# Patient Record
Sex: Male | Born: 1935 | Race: Black or African American | Hispanic: No | Smoking: Current every day smoker
Health system: Southern US, Community
[De-identification: ages and names within clinical notes are randomized; demographics above are authoritative.]

## PROBLEM LIST (undated history)

## (undated) DIAGNOSIS — D649 Anemia, unspecified: Secondary | ICD-10-CM

## (undated) DIAGNOSIS — C801 Malignant (primary) neoplasm, unspecified: Secondary | ICD-10-CM

## (undated) DIAGNOSIS — R319 Hematuria, unspecified: Secondary | ICD-10-CM

## (undated) DIAGNOSIS — F0390 Unspecified dementia without behavioral disturbance: Secondary | ICD-10-CM

## (undated) DIAGNOSIS — R6251 Failure to thrive (child): Secondary | ICD-10-CM

## (undated) DIAGNOSIS — M810 Age-related osteoporosis without current pathological fracture: Secondary | ICD-10-CM

---

## 2006-09-12 ENCOUNTER — Other Ambulatory Visit: Payer: Self-pay

## 2006-09-13 ENCOUNTER — Inpatient Hospital Stay: Payer: Self-pay | Admitting: Internal Medicine

## 2007-04-11 ENCOUNTER — Emergency Department: Payer: Self-pay | Admitting: Emergency Medicine

## 2010-04-03 ENCOUNTER — Ambulatory Visit: Payer: Self-pay | Admitting: Urology

## 2015-04-21 ENCOUNTER — Emergency Department: Payer: Medicare Other

## 2015-04-21 ENCOUNTER — Encounter: Payer: Self-pay | Admitting: Emergency Medicine

## 2015-04-21 ENCOUNTER — Emergency Department
Admission: EM | Admit: 2015-04-21 | Discharge: 2015-04-21 | Disposition: A | Discharge status: Home / Self Care | Payer: Medicare Other | Attending: Emergency Medicine | Admitting: Emergency Medicine

## 2015-04-21 DIAGNOSIS — R1031 Right lower quadrant pain: Secondary | ICD-10-CM | POA: Diagnosis present

## 2015-04-21 DIAGNOSIS — F17200 Nicotine dependence, unspecified, uncomplicated: Secondary | ICD-10-CM | POA: Insufficient documentation

## 2015-04-21 DIAGNOSIS — F0390 Unspecified dementia without behavioral disturbance: Secondary | ICD-10-CM | POA: Diagnosis not present

## 2015-04-21 DIAGNOSIS — I499 Cardiac arrhythmia, unspecified: Secondary | ICD-10-CM | POA: Diagnosis not present

## 2015-04-21 DIAGNOSIS — R109 Unspecified abdominal pain: Secondary | ICD-10-CM | POA: Diagnosis not present

## 2015-04-21 HISTORY — DX: Malignant (primary) neoplasm, unspecified: C80.1

## 2015-04-21 HISTORY — DX: Unspecified dementia, unspecified severity, without behavioral disturbance, psychotic disturbance, mood disturbance, and anxiety: F03.90

## 2015-04-21 LAB — CBC WITH DIFFERENTIAL/PLATELET
Basophils Absolute: 0.1 10*3/uL (ref 0–0.1)
Basophils Relative: 1 %
Eosinophils Absolute: 0.7 10*3/uL (ref 0–0.7)
Eosinophils Relative: 10 %
HEMATOCRIT: 38.7 % — AB (ref 40.0–52.0)
Hemoglobin: 12.8 g/dL — ABNORMAL LOW (ref 13.0–18.0)
LYMPHS ABS: 2.6 10*3/uL (ref 1.0–3.6)
LYMPHS PCT: 40 %
MCH: 31.9 pg (ref 26.0–34.0)
MCHC: 33 g/dL (ref 32.0–36.0)
MCV: 96.6 fL (ref 80.0–100.0)
MONO ABS: 0.7 10*3/uL (ref 0.2–1.0)
MONOS PCT: 10 %
NEUTROS ABS: 2.5 10*3/uL (ref 1.4–6.5)
Neutrophils Relative %: 39 %
Platelets: 262 10*3/uL (ref 150–440)
RBC: 4 MIL/uL — ABNORMAL LOW (ref 4.40–5.90)
RDW: 13.8 % (ref 11.5–14.5)
WBC: 6.5 10*3/uL (ref 3.8–10.6)

## 2015-04-21 LAB — COMPREHENSIVE METABOLIC PANEL
ALBUMIN: 4.1 g/dL (ref 3.5–5.0)
ALT: 12 U/L — ABNORMAL LOW (ref 17–63)
AST: 23 U/L (ref 15–41)
Alkaline Phosphatase: 58 U/L (ref 38–126)
Anion gap: 7 (ref 5–15)
BILIRUBIN TOTAL: 0.9 mg/dL (ref 0.3–1.2)
BUN: 11 mg/dL (ref 6–20)
CO2: 32 mmol/L (ref 22–32)
CREATININE: 1.15 mg/dL (ref 0.61–1.24)
Calcium: 9.6 mg/dL (ref 8.9–10.3)
Chloride: 100 mmol/L — ABNORMAL LOW (ref 101–111)
GFR calc Af Amer: >60 mL/min (ref >60)
GFR calc non Af Amer: 55 mL/min — ABNORMAL LOW (ref >60)
Glucose, Bld: 98 mg/dL (ref 65–99)
POTASSIUM: 4.9 mmol/L (ref 3.5–5.1)
SODIUM: 139 mmol/L (ref 135–145)
Total Protein: 7.5 g/dL (ref 6.5–8.1)

## 2015-04-21 LAB — URINALYSIS COMPLETE WITH MICROSCOPIC (ARMC ONLY)
BACTERIA UA: NONE SEEN
Bilirubin Urine: NEGATIVE
GLUCOSE, UA: NEGATIVE mg/dL
Ketones, ur: NEGATIVE mg/dL
Leukocytes, UA: NEGATIVE
Nitrite: NEGATIVE
PROTEIN: NEGATIVE mg/dL
SQUAMOUS EPITHELIAL / LPF: NONE SEEN
Specific Gravity, Urine: 1.01 (ref 1.005–1.030)
WBC, UA: NONE SEEN WBC/hpf (ref 0–5)
pH: 7 (ref 5.0–8.0)

## 2015-04-21 LAB — MAGNESIUM: MAGNESIUM: 1.8 mg/dL (ref 1.7–2.4)

## 2015-04-21 LAB — PHOSPHORUS: PHOSPHORUS: 3.9 mg/dL (ref 2.5–4.6)

## 2015-04-21 LAB — TROPONIN I: Troponin I: <0.03 ng/mL (ref <0.031)

## 2015-04-21 LAB — LIPASE, BLOOD: Lipase: 27 U/L (ref 11–51)

## 2015-04-21 MED ORDER — IOHEXOL 300 MG/ML  SOLN
100.0000 mL | Freq: Once | INTRAMUSCULAR | Status: AC | PRN
  Administered 2015-04-21: 100 mL via INTRAVENOUS

## 2015-04-21 MED ORDER — IOHEXOL 300 MG/ML  SOLN: 100.0000 mL | Freq: Once | INTRAMUSCULAR | Status: DC | PRN

## 2015-04-21 NOTE — Discharge Instructions (Signed)

## 2015-04-21 NOTE — ED Notes (Signed)
Pt via ems from [facility] with abdominal pain x 7 days. He was treated by their facility MD for possible UTI with cipro, has finished course and still c/o pain in abdomen. He states the pain is "all over" but facility reports RLQ pain. Pt tender upon palpation and even light touch

## 2015-04-21 NOTE — ED Notes (Signed)
Pt denies pain at this time

## 2015-04-21 NOTE — ED Provider Notes (Addendum)
[facility] Emergency Department Provider Note  ____________________________________________  Time seen: 12:05 PM  I have reviewed the triage vital signs and the nursing notes.   HISTORY  Chief Complaint Abdominal Pain  Level 5 caveat:  Portions of the history and physical were unable to be obtained due to the patient's chronic dementia   HPI Phillip Vance is a 82 y.o. male sent to the ED from his nursing home for abdominal pain for the past week. Recently completed Cipro for UTI with out any change in the pain. Per the triage of the patient describes the pain as all over and the facility reports right lower quadrant pain. To me the patient denies any pain whatsoever. He denies any complaints at all no chest pain shortness of breath vomiting or cough.     Past Medical History  Diagnosis Date  . Dementia   . Cancer ([place])      There are no active problems to display for this patient.    History reviewed. No pertinent past surgical history.   No current outpatient prescriptions on file. Printed medication reconciliation accompanies patient  Allergies Review of patient's allergies indicates no known allergies.   History reviewed. No pertinent family history.  Social History Social History  Substance Use Topics  . Smoking status: Current Every Day Smoker  . Smokeless tobacco: None  . Alcohol Use: No    Review of Systems  Constitutional:   No fever or chills. No weight changes Eyes:   No blurry vision or double vision.  ENT:   No sore throat. Cardiovascular:   No chest pain. Respiratory:   No dyspnea or cough. Gastrointestinal:   Patient denies any abdominal pain vomiting diarrhea or change in bowel habits. Other care providers reports generalized abdominal pain.. Genitourinary:   Negative for dysuria, urinary retention, bloody urine, or difficulty urinating. Musculoskeletal:   Negative for back pain. No joint swelling or  pain. Skin:   Negative for rash. Neurological:   Negative for headaches, focal weakness or numbness.   10-point ROS otherwise negative.  ____________________________________________   PHYSICAL EXAM:  VITAL SIGNS: ED Triage Vitals  Enc Vitals Group     BP 04/21/15 1149 165/97 mmHg     Pulse Rate 04/21/15 1149 65     Resp 04/21/15 1149 18     Temp 04/21/15 1149 98.6 F (37 C)     Temp Source 04/21/15 1149 Oral     SpO2 04/21/15 1149 98 %     Weight 04/21/15 1149 113 lb (51.256 kg)     Height 04/21/15 1149 5\' 11"  (1.803 m)     Head Cir --      Peak Flow --      Pain Score --      Pain Loc --      Pain Edu? --      Excl. in [place]? --     Vital signs reviewed, nursing assessments reviewed.   Constitutional:   Alert and oriented to self. Well appearing and in no distress. Somewhat emaciated Eyes:   No scleral icterus. No conjunctival pallor. PERRL. EOMI ENT   Head:   Normocephalic and atraumatic.   Nose:   No congestion/rhinnorhea. No septal hematoma   Mouth/Throat:   MMM, no pharyngeal erythema. No peritonsillar mass. No uvula shift.   Neck:   No stridor. No SubQ emphysema. No meningismus. Hematological/Lymphatic/Immunilogical:   No cervical lymphadenopathy. Cardiovascular:   Irregularly irregular rhythm, on the monitor appears to be sinus  rhythm with frequent PVCs.. Normal and symmetric distal pulses are present in all extremities. No murmurs, rubs, or gallops. Respiratory:   Normal respiratory effort without tachypnea nor retractions. Bibasilar crackles. No wheezing. Patient is coughing up phlegm during exam.  Gastrointestinal:   Soft and nontender.. No distention. There is no CVA tenderness.  No rebound, rigidity, or guarding. Genitourinary:   deferred Musculoskeletal:   Nontender with normal range of motion in all extremities. No joint effusions.  No lower extremity tenderness.  No edema. Neurologic:   Normal speech and language.  CN 2-10 normal. Motor  grossly intact.  No gross focal neurologic deficits are appreciated.  Skin:    Skin is warm, dry and intact. No rash noted.  No petechiae, purpura, or bullae. ____________________________________________    LABS (pertinent positives/negatives) (all labs ordered are listed, but only abnormal results are displayed) Labs Reviewed  COMPREHENSIVE METABOLIC PANEL - Abnormal; Notable for the following:    Chloride 100 (*)    ALT 12 (*)    GFR calc non Af Amer 55 (*)    All other components within normal limits  CBC WITH DIFFERENTIAL/PLATELET - Abnormal; Notable for the following:    RBC 4.00 (*)    Hemoglobin 12.8 (*)    HCT 38.7 (*)    All other components within normal limits  URINALYSIS COMPLETEWITH MICROSCOPIC ([facility] ONLY) - Abnormal; Notable for the following:    Color, Urine YELLOW (*)    APPearance CLEAR (*)    Hgb urine dipstick 1+ (*)    All other components within normal limits  URINE CULTURE  LIPASE, BLOOD  TROPONIN I  MAGNESIUM  PHOSPHORUS   ____________________________________________   EKG   Interpreted by me Atrial paced rhythm rate of 81, right axis, normal intervals. Poor R-wave progression in anterior precordial leads. Normal ST segments and T waves. ____________________________________________    RADIOLOGY  CT abdomen and pelvis reveals no acute findings  ____________________________________________   PROCEDURES   ____________________________________________   INITIAL IMPRESSION / ASSESSMENT AND PLAN / ED COURSE  Pertinent labs & imaging results that were available during my care of the patient were reviewed by me and considered in my medical decision making (see chart for details).  Patient presents with generalized abdominal pain, possibly worse in the right lower quadrant. History and exam are limited by the patient's dementia. No significant prior records to assist [place] in the electronic medical record. We'll give IV fluids, check labs and CT  scan of the abdomen pelvis. With the crackles also get a chest x-ray and EKG.   ----------------------------------------- 3:37 PM on 04/21/2015 -----------------------------------------  Patient remains calm and comfortable. Vital signs stable. Exam is reassuring.  Urinary tract infection appears to be adequately treated. We'll discharge home, follow up with primary care.  Low suspicion for occult biliary pathology or cardiopulmonary process.    ____________________________________________   FINAL CLINICAL IMPRESSION(S) / ED DIAGNOSES  Final diagnoses:  Abdominal pain, unspecified abdominal location      Carrie Mew, MD 04/21/15 Story, MD 04/21/15 1538

## 2015-04-21 NOTE — ED Notes (Signed)
Pt discharged home after verbalizing understanding of discharge instructions; nad noted. 

## 2015-04-23 LAB — URINE CULTURE

## 2015-11-24 ENCOUNTER — Emergency Department
Admission: EM | Admit: 2015-11-24 | Discharge: 2015-11-24 | Disposition: A | Discharge status: Home / Self Care | Payer: Medicare Other | Attending: Emergency Medicine | Admitting: Emergency Medicine

## 2015-11-24 ENCOUNTER — Emergency Department: Payer: Medicare Other

## 2015-11-24 DIAGNOSIS — R531 Weakness: Secondary | ICD-10-CM | POA: Insufficient documentation

## 2015-11-24 DIAGNOSIS — F17200 Nicotine dependence, unspecified, uncomplicated: Secondary | ICD-10-CM | POA: Diagnosis not present

## 2015-11-24 DIAGNOSIS — Z859 Personal history of malignant neoplasm, unspecified: Secondary | ICD-10-CM | POA: Insufficient documentation

## 2015-11-24 DIAGNOSIS — R627 Adult failure to thrive: Secondary | ICD-10-CM | POA: Diagnosis present

## 2015-11-24 DIAGNOSIS — E860 Dehydration: Secondary | ICD-10-CM | POA: Insufficient documentation

## 2015-11-24 LAB — CBC WITH DIFFERENTIAL/PLATELET
Basophils Absolute: 0.1 10*3/uL (ref 0–0.1)
Basophils Relative: 1 %
EOS ABS: 0.6 10*3/uL (ref 0–0.7)
EOS PCT: 8 %
HCT: 38.5 % — ABNORMAL LOW (ref 40.0–52.0)
Hemoglobin: 12.9 g/dL — ABNORMAL LOW (ref 13.0–18.0)
LYMPHS ABS: 2.4 10*3/uL (ref 1.0–3.6)
LYMPHS PCT: 32 %
MCH: 32.2 pg (ref 26.0–34.0)
MCHC: 33.6 g/dL (ref 32.0–36.0)
MCV: 95.7 fL (ref 80.0–100.0)
MONO ABS: 1 10*3/uL (ref 0.2–1.0)
MONOS PCT: 14 %
Neutro Abs: 3.4 10*3/uL (ref 1.4–6.5)
Neutrophils Relative %: 45 %
PLATELETS: 264 10*3/uL (ref 150–440)
RBC: 4.02 MIL/uL — ABNORMAL LOW (ref 4.40–5.90)
RDW: 13.4 % (ref 11.5–14.5)
WBC: 7.4 10*3/uL (ref 3.8–10.6)

## 2015-11-24 LAB — TROPONIN I

## 2015-11-24 LAB — URINALYSIS COMPLETE WITH MICROSCOPIC (ARMC ONLY)
BILIRUBIN URINE: NEGATIVE
Bacteria, UA: NONE SEEN
GLUCOSE, UA: NEGATIVE mg/dL
HGB URINE DIPSTICK: NEGATIVE
KETONES UR: NEGATIVE mg/dL
Leukocytes, UA: NEGATIVE
NITRITE: NEGATIVE
Protein, ur: NEGATIVE mg/dL
Specific Gravity, Urine: 1.014 (ref 1.005–1.030)
Squamous Epithelial / LPF: NONE SEEN
pH: 7 (ref 5.0–8.0)

## 2015-11-24 LAB — COMPREHENSIVE METABOLIC PANEL
ALT: 12 U/L — AB (ref 17–63)
ANION GAP: 5 (ref 5–15)
AST: 20 U/L (ref 15–41)
Albumin: 3.7 g/dL (ref 3.5–5.0)
Alkaline Phosphatase: 48 U/L (ref 38–126)
BUN: 33 mg/dL — ABNORMAL HIGH (ref 6–20)
CHLORIDE: 102 mmol/L (ref 101–111)
CO2: 32 mmol/L (ref 22–32)
CREATININE: 1.38 mg/dL — AB (ref 0.61–1.24)
Calcium: 9.2 mg/dL (ref 8.9–10.3)
GFR, EST AFRICAN AMERICAN: 51 mL/min — AB (ref >60)
GFR, EST NON AFRICAN AMERICAN: 44 mL/min — AB (ref >60)
Glucose, Bld: 99 mg/dL (ref 65–99)
POTASSIUM: 4 mmol/L (ref 3.5–5.1)
SODIUM: 139 mmol/L (ref 135–145)
Total Bilirubin: 0.6 mg/dL (ref 0.3–1.2)
Total Protein: 7.2 g/dL (ref 6.5–8.1)

## 2015-11-24 MED ORDER — SODIUM CHLORIDE 0.9 % IV SOLN
1000.0000 mL | Freq: Once | INTRAVENOUS | Status: AC
  Administered 2015-11-24: 1000 mL via INTRAVENOUS

## 2015-11-24 NOTE — ED Notes (Signed)
Pt tolerated food and water, family at bedside

## 2015-11-24 NOTE — ED Triage Notes (Signed)
Pt presents to Ed via EMS from [facility] for original call for SOB, however pt is not in distress. Facility concerned about change in condition due to difficulties eating/swallowing. Pt alert and oriented on arrival.

## 2015-11-24 NOTE — ED Provider Notes (Addendum)
[facility] Emergency Department Provider Note        Time seen: ----------------------------------------- 11:44 AM on 11/24/2015 -----------------------------------------    I have reviewed the triage vital signs and the nursing notes.   HISTORY  Chief Complaint Failure To Thrive    HPI Phillip Vance is a 82 y.o. male who presents to ER for failure to thrive. He arrives from [facility] and originally recalls May for shortness of breath but he was not noted to be in any distress. The facility was concerned about a change in his condition due to her poor by mouth intake. Patient states he is not hungry and has no appetite. He arrives alert and oriented on arrival. Patient denies pain or recent illness. He does have a history of dementia   Past Medical History:  Diagnosis Date  . Cancer ([place])   . Dementia     There are no active problems to display for this patient.   No past surgical history on file.  Allergies Review of patient's allergies indicates no known allergies.  Social History Social History  Substance Use Topics  . Smoking status: Current Every Day Smoker  . Smokeless tobacco: Not on file  . Alcohol use No    Review of Systems Constitutional: Negative for fever.Positive for decreased appetite Cardiovascular: Negative for chest pain. Respiratory: Negative for shortness of breath. Gastrointestinal: Negative for abdominal pain, vomiting and diarrhea. Genitourinary: Negative for dysuria. Musculoskeletal: Negative for back pain. Skin: Negative for rash. Neurological: Negative for headaches, focal weakness or numbness.  10-point ROS otherwise negative.  ____________________________________________   PHYSICAL EXAM:  VITAL SIGNS: ED Triage Vitals  Enc Vitals Group     BP      Pulse      Resp      Temp      Temp src      SpO2      Weight      Height      Head Circumference      Peak Flow      Pain  Score      Pain Loc      Pain Edu?      Excl. in [place]?     Constitutional: Alert, Cachectic. No acute distress. Eyes: Conjunctivae are normal. PERRL. Normal extraocular movements. ENT   Head: Normocephalic and atraumatic.   Nose: No congestion/rhinnorhea.   Mouth/Throat: Mucous membranes are moist.   Neck: No stridor. Cardiovascular: Normal rate, regular rhythm. No murmurs, rubs, or gallops. Respiratory: Normal respiratory effort without tachypnea nor retractions. Breath sounds are clear and equal bilaterally. No wheezes/rales/rhonchi. Gastrointestinal: Soft and nontender. Normal bowel sounds Musculoskeletal: Nontender with normal range of motion in all extremities. No lower extremity tenderness nor edema. Neurologic:  Normal speech and language. No gross focal neurologic deficits are appreciated.  Skin:  Skin is warm, dry and intact. No rash noted. Psychiatric: Mood and affect are normal.  ____________________________________________  EKG: Interpreted by me. Atrial paced rhythm at 71 bpm, no other acute changes are identified.  ____________________________________________  ED COURSE:  Pertinent labs & imaging results that were available during my care of the patient were reviewed by me and considered in my medical decision making (see chart for details). Clinical Course  Patient presents in no acute distress, we will assess with basic labs, give IV fluid and reevaluate.  Procedures ____________________________________________   LABS (pertinent positives/negatives)  Labs Reviewed  CBC WITH DIFFERENTIAL/PLATELET - Abnormal; Notable for the following:  Result Value   RBC 4.02 (*)    Hemoglobin 12.9 (*)    HCT 38.5 (*)    All other components within normal limits  COMPREHENSIVE METABOLIC PANEL - Abnormal; Notable for the following:    BUN 33 (*)    Creatinine, Ser 1.38 (*)    ALT 12 (*)    GFR calc non Af Amer 44 (*)    GFR calc Af Amer 51 (*)    All  other components within normal limits  URINALYSIS COMPLETEWITH MICROSCOPIC ([facility] ONLY) - Abnormal; Notable for the following:    Color, Urine YELLOW (*)    APPearance CLEAR (*)    All other components within normal limits  TROPONIN I    RADIOLOGY  Chest x-ray Is unremarkable ____________________________________________  FINAL ASSESSMENT AND PLAN  Weakness, Mild dehydration  Plan: Patient with labs and imaging as dictated above. Patient is in no distress, labs are grossly unremarkable for him. He was given a saline bolus and overall appears stable for outpatient follow-up.   Earleen Newport, MD   Note: This dictation was prepared with Dragon dictation. Any transcriptional errors that result from this process are unintentional    Earleen Newport, MD 11/24/15 1420    Earleen Newport, MD 11/24/15 [phone]

## 2016-08-10 ENCOUNTER — Emergency Department (HOSPITAL_COMMUNITY): Payer: Medicare Other

## 2016-08-10 ENCOUNTER — Emergency Department (HOSPITAL_COMMUNITY)
Admission: EM | Admit: 2016-08-10 | Discharge: 2016-08-11 | Disposition: A | Discharge status: Home / Self Care | Payer: Medicare Other | Attending: Emergency Medicine | Admitting: Emergency Medicine

## 2016-08-10 ENCOUNTER — Encounter (HOSPITAL_COMMUNITY): Payer: Self-pay | Admitting: *Deleted

## 2016-08-10 DIAGNOSIS — R55 Syncope and collapse: Secondary | ICD-10-CM | POA: Diagnosis present

## 2016-08-10 DIAGNOSIS — F0391 Unspecified dementia with behavioral disturbance: Secondary | ICD-10-CM | POA: Diagnosis not present

## 2016-08-10 DIAGNOSIS — Z79899 Other long term (current) drug therapy: Secondary | ICD-10-CM | POA: Insufficient documentation

## 2016-08-10 DIAGNOSIS — R531 Weakness: Secondary | ICD-10-CM

## 2016-08-10 DIAGNOSIS — G9340 Encephalopathy, unspecified: Secondary | ICD-10-CM

## 2016-08-10 DIAGNOSIS — I679 Cerebrovascular disease, unspecified: Secondary | ICD-10-CM

## 2016-08-10 DIAGNOSIS — E860 Dehydration: Secondary | ICD-10-CM | POA: Insufficient documentation

## 2016-08-10 DIAGNOSIS — F17200 Nicotine dependence, unspecified, uncomplicated: Secondary | ICD-10-CM | POA: Diagnosis not present

## 2016-08-10 DIAGNOSIS — R4182 Altered mental status, unspecified: Secondary | ICD-10-CM | POA: Diagnosis not present

## 2016-08-10 HISTORY — DX: Anemia, unspecified: D64.9

## 2016-08-10 HISTORY — DX: Hematuria, unspecified: R31.9

## 2016-08-10 HISTORY — DX: Age-related osteoporosis without current pathological fracture: M81.0

## 2016-08-10 HISTORY — DX: Failure to thrive (child): R62.51

## 2016-08-10 LAB — I-STAT CHEM 8, ED
BUN: 47 mg/dL — ABNORMAL HIGH (ref 6–20)
CALCIUM ION: 1.12 mmol/L — AB (ref 1.15–1.40)
Chloride: 100 mmol/L — ABNORMAL LOW (ref 101–111)
Creatinine, Ser: 1.9 mg/dL — ABNORMAL HIGH (ref 0.61–1.24)
GLUCOSE: 138 mg/dL — AB (ref 65–99)
HCT: 29 % — ABNORMAL LOW (ref 39.0–52.0)
HEMOGLOBIN: 9.9 g/dL — AB (ref 13.0–17.0)
POTASSIUM: 4.3 mmol/L (ref 3.5–5.1)
Sodium: 140 mmol/L (ref 135–145)
TCO2: 29 mmol/L (ref 0–100)

## 2016-08-10 LAB — COMPREHENSIVE METABOLIC PANEL
ALT: 11 U/L — AB (ref 17–63)
ANION GAP: 7 (ref 5–15)
AST: 24 U/L (ref 15–41)
Albumin: 3.4 g/dL — ABNORMAL LOW (ref 3.5–5.0)
Alkaline Phosphatase: 49 U/L (ref 38–126)
BUN: 46 mg/dL — ABNORMAL HIGH (ref 6–20)
CHLORIDE: 102 mmol/L (ref 101–111)
CO2: 27 mmol/L (ref 22–32)
CREATININE: 1.75 mg/dL — AB (ref 0.61–1.24)
Calcium: 9 mg/dL (ref 8.9–10.3)
GFR, EST AFRICAN AMERICAN: 38 mL/min — AB (ref >60)
GFR, EST NON AFRICAN AMERICAN: 33 mL/min — AB (ref >60)
Glucose, Bld: 142 mg/dL — ABNORMAL HIGH (ref 65–99)
Potassium: 4.3 mmol/L (ref 3.5–5.1)
Sodium: 136 mmol/L (ref 135–145)
Total Bilirubin: 0.2 mg/dL — ABNORMAL LOW (ref 0.3–1.2)
Total Protein: 6.1 g/dL — ABNORMAL LOW (ref 6.5–8.1)

## 2016-08-10 LAB — DIFFERENTIAL
BASOS PCT: 0 %
Basophils Absolute: 0 10*3/uL (ref 0.0–0.1)
EOS ABS: 0.4 10*3/uL (ref 0.0–0.7)
EOS PCT: 6 %
Lymphocytes Relative: 39 %
Lymphs Abs: 2.3 10*3/uL (ref 0.7–4.0)
MONO ABS: 0.7 10*3/uL (ref 0.1–1.0)
MONOS PCT: 12 %
NEUTROS ABS: 2.5 10*3/uL (ref 1.7–7.7)
Neutrophils Relative %: 43 %

## 2016-08-10 LAB — CBC
HEMATOCRIT: 32.6 % — AB (ref 39.0–52.0)
Hemoglobin: 10.1 g/dL — ABNORMAL LOW (ref 13.0–17.0)
MCH: 30.6 pg (ref 26.0–34.0)
MCHC: 31 g/dL (ref 30.0–36.0)
MCV: 98.8 fL (ref 78.0–100.0)
PLATELETS: 217 10*3/uL (ref 150–400)
RBC: 3.3 MIL/uL — ABNORMAL LOW (ref 4.22–5.81)
RDW: 13.4 % (ref 11.5–15.5)
WBC: 5.9 10*3/uL (ref 4.0–10.5)

## 2016-08-10 LAB — URINALYSIS, ROUTINE W REFLEX MICROSCOPIC
Bilirubin Urine: NEGATIVE
Glucose, UA: NEGATIVE mg/dL
HGB URINE DIPSTICK: NEGATIVE
Ketones, ur: NEGATIVE mg/dL
Leukocytes, UA: NEGATIVE
Nitrite: NEGATIVE
PROTEIN: NEGATIVE mg/dL
Specific Gravity, Urine: 1.014 (ref 1.005–1.030)
pH: 7 (ref 5.0–8.0)

## 2016-08-10 LAB — I-STAT CG4 LACTIC ACID, ED
LACTIC ACID, VENOUS: 2.12 mmol/L — AB (ref 0.5–1.9)
LACTIC ACID, VENOUS: 0.84 mmol/L (ref 0.5–1.9)

## 2016-08-10 LAB — I-STAT TROPONIN, ED
TROPONIN I, POC: 0 ng/mL (ref 0.00–0.08)
Troponin i, poc: 0.01 ng/mL (ref 0.00–0.08)

## 2016-08-10 LAB — PROTIME-INR
INR: 0.99
PROTHROMBIN TIME: 13.1 s (ref 11.4–15.2)

## 2016-08-10 LAB — APTT: aPTT: 29 seconds (ref 24–36)

## 2016-08-10 MED ORDER — SODIUM CHLORIDE 0.9 % IV BOLUS (SEPSIS)
500.0000 mL | Freq: Once | INTRAVENOUS | Status: AC
  Administered 2016-08-10: 500 mL via INTRAVENOUS

## 2016-08-10 NOTE — ED Notes (Signed)
The pt is going back to [facility] in [place]  Report called to that nursing home ptar called to transport back   Family has left the pt is sleeping at present

## 2016-08-10 NOTE — ED Notes (Signed)
Pt waiting on ptar to transport back to nursing home

## 2016-08-10 NOTE — ED Provider Notes (Signed)
[facility] DEPT Provider Note   CSN: [id number] Arrival date & time: 08/10/16  1704     History   Chief Complaint Chief Complaint  Patient presents with  . Code Stroke    HPI Phillip Vance is a 83 y.o. male.  The history is provided by the EMS personnel and a relative. The history is limited by the condition of the patient (Dementia).    Patient is a 83 year old male past medical history significant for carcinoma, dementia, tobacco use, pacemaker in place, who presents to the emergency department initially as an activated code stroke. Per EMS report, patient went out to smoke a cigarette, had a syncopal episode. Initial blood pressure when EMS arrived 70/40, questionable facial droop. Patient was minimally responding to questions. When EMS arrived to the emergency department, after receiving IV fluid blood pressure improved to 100s/80s. Patient able to state his name. Denies any complaints at this time.   Past Medical History:  Diagnosis Date  . Anemia   . Cancer ([place])   . Dementia   . Failure to thrive (0-17)   . Hematuria   . Osteoporosis     There are no active problems to display for this patient.   History reviewed. No pertinent surgical history.     Home Medications    Prior to Admission medications   Medication Sig Start Date End Date Taking? Authorizing Provider  albuterol (PROVENTIL HFA;VENTOLIN HFA) 108 (90 Base) MCG/ACT inhaler Inhale 2 puffs into the lungs every 6 (six) hours as needed for wheezing or shortness of breath.   Yes [provider]  bicalutamide (CASODEX) 50 MG tablet Take 50 mg by mouth daily.  07/13/16  Yes [provider]  brimonidine-timolol (COMBIGAN) 0.2-0.5 % ophthalmic solution Place 1 drop into both eyes 2 (two) times daily.   Yes [provider]  Calcium Carbonate-Vitamin D3 (CALCIUM 600-D) 600-400 MG-UNIT TABS Take 1 tablet by mouth daily.   Yes [provider]  LORazepam (ATIVAN) 0.5 MG  tablet Take 0.5 mg by mouth daily.  08/09/16  Yes [provider]  memantine (NAMENDA) 5 MG tablet Take 5 mg by mouth 2 (two) times daily.  07/03/16  Yes [provider]  mirtazapine (REMERON) 15 MG tablet Take 15 mg by mouth at bedtime.   Yes [provider]  Multiple Vitamin (MULTIVITAMIN WITH MINERALS) TABS tablet Take 1 tablet by mouth daily. Centrum Silver aka ABC Plus Senior   Yes [provider]  Nutritional Supplements (BOOST PO) Take 1 Bottle by mouth 2 (two) times daily between meals.   Yes [provider]  Polyethyl Glycol-Propyl Glycol (SYSTANE) 0.4-0.3 % SOLN Place 1 drop into both eyes 2 (two) times daily.   Yes [provider]  sertraline (ZOLOFT) 25 MG tablet Take 25 mg by mouth daily.   Yes [provider]  Tiotropium Bromide-Olodaterol (STIOLTO RESPIMAT) 2.5-2.5 MCG/ACT AERS Inhale 2 puffs into the lungs daily.   Yes [provider]  Travoprost, BAK Free, (TRAVATAN) 0.004 % SOLN ophthalmic solution Place 1 drop into both eyes at bedtime.   Yes [provider]  traZODone (DESYREL) 50 MG tablet Take 25 mg by mouth at bedtime. For sleep and depression 07/13/16  Yes [provider]    Family History No family history on file.  Social History Social History  Substance Use Topics  . Smoking status: Current Every Day Smoker  . Smokeless tobacco: Never Used  . Alcohol use No     Allergies  Patient has no known allergies.   Review of Systems Review of Systems  Unable to perform ROS: Dementia     Physical Exam Updated Vital Signs BP 95/60   Pulse 61   Temp 99.7 F (37.6 C) (Rectal)   Resp 17   SpO2 97%   Physical Exam  Constitutional:  Cachectic  HENT:  Head: Atraumatic.  Dry mucous membranes  Eyes: EOM are normal. Pupils are equal, round, and reactive to light.  Neck: Normal range of motion. Neck supple. No JVD present.  Cardiovascular: Normal rate and regular rhythm.     Pacemaker in place  Pulmonary/Chest: Effort normal and breath sounds normal. No respiratory distress. He has no wheezes.  Abdominal: Soft. He exhibits no distension and no mass. There is no tenderness. There is no guarding.  Musculoskeletal: Normal range of motion. He exhibits no edema.  No unilateral leg swelling.  Neurological: He is alert. He has normal strength. No cranial nerve deficit or sensory deficit. GCS eye subscore is 4. GCS verbal subscore is 4. GCS motor subscore is 6.  Oriented to person only. Per patient's family members at bedside, he is at his mental status baseline.  Skin: Skin is warm. Capillary refill takes less than 2 seconds.  Psychiatric: He has a normal mood and affect.     ED Treatments / Results  Labs (all labs ordered are listed, but only abnormal results are displayed) Labs Reviewed  CBC - Abnormal; Notable for the following:       Result Value   RBC 3.30 (*)    Hemoglobin 10.1 (*)    HCT 32.6 (*)    All other components within normal limits  COMPREHENSIVE METABOLIC PANEL - Abnormal; Notable for the following:    Glucose, Bld 142 (*)    BUN 46 (*)    Creatinine, Ser 1.75 (*)    Total Protein 6.1 (*)    Albumin 3.4 (*)    ALT 11 (*)    Total Bilirubin 0.2 (*)    GFR calc non Af Amer 33 (*)    GFR calc Af Amer 38 (*)    All other components within normal limits  URINALYSIS, ROUTINE W REFLEX MICROSCOPIC - Abnormal; Notable for the following:    APPearance HAZY (*)    All other components within normal limits  I-STAT CHEM 8, ED - Abnormal; Notable for the following:    Chloride 100 (*)    BUN 47 (*)    Creatinine, Ser 1.90 (*)    Glucose, Bld 138 (*)    Calcium, Ion 1.12 (*)    Hemoglobin 9.9 (*)    HCT 29.0 (*)    All other components within normal limits  I-STAT CG4 LACTIC ACID, ED - Abnormal; Notable for the following:    Lactic Acid, Venous 2.12 (*)    All other components within normal limits  PROTIME-INR  APTT  DIFFERENTIAL  I-STAT  TROPOININ, ED  CBG MONITORING, ED  I-STAT TROPOININ, ED  I-STAT CG4 LACTIC ACID, ED    EKG  EKG Interpretation None       Radiology Dg Chest 2 View  Result Date: 08/10/2016 CLINICAL DATA:  83 year old male with history of acute mental status change EXAM: CHEST  2 VIEW COMPARISON:  Chest x-ray 11/24/2015. FINDINGS: Diffuse peribronchial cuffing, similar to prior examinations. Chronic atelectasis of the right middle lobe, similar to the prior examination from 04/21/2015. No definite acute consolidative airspace disease. No pleural effusions. Enlarging mass in the left upper  lobe currently measuring at least 4.5 cm in diameter. No evidence of pulmonary edema. Heart size is borderline enlarged. Upper mediastinal contours are within normal limits. Atherosclerosis in the thoracic aorta. Left-sided pacemaker device in position with lead tips projecting over the expected location of the right atrium and right ventricular apex. Numerous surgical clips project over the left upper retroperitoneum. Old compression fractures in the lower thoracic spine, similar to the prior study. IMPRESSION: 1. Mass-like area in the left upper lobe concerning for neoplasm. Further evaluation with contrast enhanced chest CT is recommended at this time. 2. Diffuse peribronchial cuffing, similar to prior studies, presumably related to chronic bronchitis. 3. Aortic atherosclerosis. 4. Chronic right middle lobe atelectasis. 5. Additional incidental findings, as above. Electronically Signed   By: Vinnie Langton M.D.   On: 08/10/2016 18:51   Ct Head Code Stroke W/o Cm  Result Date: 08/10/2016 CLINICAL DATA:  Code stroke. Altered mental status. Acute onset of right-sided facial droop and right-sided weakness. EXAM: CT HEAD WITHOUT CONTRAST TECHNIQUE: Contiguous axial images were obtained from the base of the skull through the vertex without intravenous contrast. COMPARISON:  MRI brain 09/15/2006. FINDINGS: Brain: Advanced atrophy  and white matter disease is present bilaterally. Remote lacunar infarcts and dilated perivascular spaces are noted within the basal ganglia bilaterally. Basal ganglia are grossly intact. The insular ribbon is normal. No acute or focal cortical defect is evident. Extensive white matter disease is present bilaterally. The brainstem and cerebellum are normal. Vascular: Atherosclerotic calcifications are present within the cavernous internal carotid artery's bilaterally. There is no hyperdense vessel. Skull: The calvarium is within normal limits. No focal lytic or blastic lesions are present. No significant extracranial soft tissue injury is present. Sinuses/Orbits: The paranasal sinuses and mastoid air cells are clear. Cerumen is noted within the left external auditory canal. ASPECTS ([place] Stroke Program Early CT Score) - Ganglionic level infarction (caudate, lentiform nuclei, internal capsule, insula, M1-M3 cortex): 7/7 - Supraganglionic infarction (M4-M6 cortex): 3/3 Total score (0-10 with 10 being normal): 7/7 IMPRESSION: 1. Advanced atrophy and white matter disease without definite acute or focal abnormality to explain the patient's acute symptoms. 2. ASPECTS is 10/10 These results were called by telephone at the time of interpretation on 08/10/2016 at 5:29 pm to Dr. Shon Hale, who verbally acknowledged these results. Electronically Signed   By: San Morelle M.D.   On: 08/10/2016 17:29    Procedures Procedures (including critical care time)  Medications Ordered in ED Medications  sodium chloride 0.9 % bolus 500 mL (500 mLs Intravenous New Bag/Given 08/10/16 1933)     Initial Impression / Assessment and Plan / ED Course  I have reviewed the triage vital signs and the nursing notes.  Pertinent labs & imaging results that were available during my care of the patient were reviewed by me and considered in my medical decision making (see chart for details).  Clinical Course as of Aug 11 2338  Sat Aug 10, 2016  2337 ED EKG [SM]    Clinical Course User Index [SM] Nathaniel Man, MD    Patient is an 83 year old male with multiple comorbidities, who presents to the emergency department for hypotension as an activated code stroke. Questionable facial droop prior to arrival. On arrival improvement of blood pressure following 500 mL IV fluid, nonfocal neurologic exam, patient is more unresponsive. Code stroke was canceled. Patient was initially evaluated by neurology. ABCs intact. GCS 14. Nonfocal neurologic exam, benign abdominal exam.  Differential includes infectious etiology, dehydration, metabolic abnormality,  ACS, arrhythmia. EKG showed first-degree heart block, normal sinus rhythm, no chamber enlargement, no signs of acute ischemia. Chest x-ray showed a pulmonary mass without signs of pneumonia. CT head showed no acute findings. Creatinine 1.9 (baseline 1.5). No significant electrolyte abnormalities. Hemoglobin 9.9 which is decreased from prior.  Was notified that the patient was on hospice. Patient's family showed up to the emergency department, patient is DO NOT RESUSCITATE/DO NOT INTUBATE. They did not wish to admit the patient. Patient most likely with dehydration from decreased by mouth intake. Initial troponin nondetectable. Repeat blood pressure 140/90s following 1 L normal saline.  Patient stable to be discharged back to his SNF.   Final Clinical Impressions(s) / ED Diagnoses   Final diagnoses:  Weakness  Dehydration    New Prescriptions New Prescriptions   No medications on file     Nathaniel Man, MD 08/10/16 2340    Pattricia Boss, MD 08/11/16 1755

## 2016-08-10 NOTE — Consult Note (Signed)
Neurology Consult Note  Reason for Consultation: CODE STROKE  Requesting provider: Activated by EMS; ED MD Pattricia Boss  CC: None  HPI: This is an 83 year old man who is brought in by EMS as a code stroke. History is obtained by the attending paramedics as the patient is demented and unable to provide.  Medics report they were called to the patient's nursing facility after he was noted to have an altered level of consciousness. Staff indicated that the patient had gone out to smoke a cigarette at about 1500. When they went out to check on him shortly after that, they noted that he was less responsive than usual. There is some suggestion of a possible right facial droop. They called 911. EMS arrived and found the patient with limited verbal output. No clear focal deficits were appreciated. During transport he was noted to have systolic blood pressure in the 70s. He was given 250 mL of normal saline with follow-up systolic blood pressure in the 80s. They note that his mental status improved during transport with administration of fluids.  I met the patient on his arrival in the emergency department with the rest of the stroke team. He did not have any focal deficits on his examination. He is taken for an emergent CT scan of the head which showed no acute abnormality. Overall, clinical picture is not highly suggestive of stroke and code stroke was canceled.   Last known well: 1500 today NIHSS score: 1 mRS score:  3 tPA given?: No, presentation likely not stroke   PMH:  Past Medical History:  Diagnosis Date  . Cancer ([place])   . Dementia     PSH:  No past surgical history on file.  Family history: No family history on file.  Social history:  Social History   Social History  . Marital status: Divorced    Spouse name: N/A  . Number of children: N/A  . Years of education: N/A   Occupational History  . Not on file.   Social History Main Topics  . Smoking status: Current Every Day  Smoker  . Smokeless tobacco: Not on file  . Alcohol use No  . Drug use: Unknown  . Sexual activity: Not on file   Other Topics Concern  . Not on file   Social History Narrative  . No narrative on file    Current outpatient meds:  Unknown   Allergies: No Known Allergies  ROS: As per HPI. A full 14-point review of systems was performed and is otherwise unremarkable.  PE:  There were no vitals taken for this visit.  General: Thin chronically ill-appearing elderly African-American man lying on a gurney in no acute distress.  He is alert, oriented to self. There is a paucity of spontaneous speech. He follows commands well. Affect is flat. HEENT: Normocephalic. Neck supple without LAD. MMM, OP clear.  He appears edentulous. Sclerae anicteric. No conjunctival injection.  Arcus senilis noted bilaterally. CV: Regular, no murmur. Carotid pulses full and symmetric, no bruits. Distal pulses 2+ and symmetric.  Lungs: CTAB on anterior auscultation .  Abdomen: Soft,  thin,non-distended, non-tender. Bowel sounds present x4.  Extremities: No C/C/E. Neuro:  CN: Pupils are equal and round. They are symmetrically reactive from 2-->1 mm. EOMI without nystagmus. No reported diplopia. Facial sensation is intact to light touch. Face is symmetric at rest with normal strength and mobility. Hearing appears to be intact to conversational voice. Tongue is midline with normal bulk and mobility.  Motor:  Muscle  bulk is diminished diffusely. Tone appears to be normal. He has some mitgehen paratonia. Strength appears to be normal for age and level of conditioning. No tremor or other abnormal movements. No drift.  Sensation: Intact to light touch.  DTRs: 2+, symmetric with absent ankle jerks bilaterally . Toes downgoing bilaterally. No pathologic reflexes.  Coordination: Finger-to-nose and heel-to-shin are  slow but without dysmetria.   Labs:  Lab Results  Component Value Date   WBC 5.9 08/10/2016   HGB 9.9  (L) 08/10/2016   HCT 29.0 (L) 08/10/2016   PLT 217 08/10/2016   GLUCOSE 138 (H) 08/10/2016   ALT 12 (L) 11/24/2015   AST 20 11/24/2015   NA 140 08/10/2016   K 4.3 08/10/2016   CL 100 (L) 08/10/2016   CREATININE 1.90 (H) 08/10/2016   BUN 47 (H) 08/10/2016   CO2 32 11/24/2015   INR 0.99 08/10/2016   Troponin 0.00  Imaging:  I have personally and independently reviewed the CT scan of the head without contrast from today. This shows a severe burden of chronic small vessel ischemic disease involving the bihemispheric white matter. Moderate diffuse generalized atrophy is present. There is no obvious acute abnormality. This was discussed with the interpreting radiologist at the time of the study.  Assessment and Plan:  1. Acute encephalopathy: I suspect that he experienced a decline in his level of consciousness due to systemic hypoperfusion given reported blood pressures of 70s to 80s per EMS. His mentation appears to have improved with fluid bolus. He doesn't have any focal abnormalities to suggest an acute stroke. At this time I would not recommend pursuing any further stroke evaluation. I will defer further resuscitation to the ED physician. He has what sounds like a fairly severe dementia at baseline which predisposes him to encephalopathy from all causes and could predict delayed recovery from same. Optimize metabolic status as needed. Limit CNS active medications, particularly benzodiazepines, opiates, and anything with strong anticholinergic properties.  2. Dementia: As noted above, he sounds as if he has a fairly advanced dementia at baseline. Nursing facility staff reported to EMS that he is typically "talkative" and that he is ambulatory. They also reported that he tends to become easily agitated and aggressive, sometimes lashing out at staff. Recommend supportive care. As noted above, his dementia places him at risk for encephalopathy from all causes.  3. Cerebrovascular disease: Imaging  reveals significant chronic small vessel ischemic disease. No obvious acute abnormality and nothing on exam to suggest his had an acute stroke. Recommend ongoing risk factor modification.  No family is present at the time of this visit.  This was discussed with the ED attending, Dr. Jeanell Sparrow. At this time, I have no additional recommendations and will sign off. Please reconsult if any new issues should arise.

## 2016-08-10 NOTE — ED Notes (Signed)
Waiting for ptar 

## 2016-08-10 NOTE — ED Triage Notes (Addendum)
Pt here via [facility]  for Code Stroke for acute mental status change.  Though pt has dementia, they stated he was not conversing like he normally does.  Inially sbp in 80's.  Increased to 100's with 200 NS.  CBG 133.

## 2016-08-10 NOTE — ED Notes (Signed)
Hospice RN speaking with MD.

## 2016-08-10 NOTE — ED Notes (Signed)
Patient is a hospice patient

## 2016-08-10 NOTE — Code Documentation (Signed)
83 y.o. Male with PMHx of dementia and prostate cancer who was stated to be in his normal state of health today by the staff at his assisted living facility. He was stated to have gone outside to smoke a cigarette around 1500 and was stated to be normal. The staff noticed that he hadn't returned in a timely manner and went to check on him. Staff then noted that he had a decreased LOC, mild right sided facial droop and a fixed right gaze. EMS was notified and called a code stroke in the field. EMS reporting AMS, nonverbal, right facial droop, right fixed gaze and hypotension. Initial BP of 70/40 for which a 250 cc bolus was given. BP after Ns bolus 100/60. EMS reporting patient able to answer simple questions after bolus.   CT negative for acute intracranial abnormalities. ASPECTS 10.   On exam patient with no focal or lateralizing deficits.  NIHSS 1. Scored for missing age, answered with correct DOB. See EMR for NIHSS and code stroke times.   tPA not given d/t stroke not being suspected. Code stroke canceled per neurologist. ED bedside handoff with ED RN Hassan Rowan.

## 2016-08-11 DIAGNOSIS — E860 Dehydration: Secondary | ICD-10-CM | POA: Diagnosis not present

## 2016-08-12 LAB — CBG MONITORING, ED: GLUCOSE-CAPILLARY: 134 mg/dL — AB (ref 65–99)

## 2016-10-02 DEATH — deceased

## 2019-04-04 IMAGING — CT CT HEAD CODE STROKE
3 series · 15 of 47 positions shown, 18 images · non-contrast
Comparison: MRI brain 09/15/2006.

CLINICAL DATA: Code stroke. Altered mental status. Acute onset of
right-sided facial droop and right-sided weakness.

EXAM:
CT HEAD WITHOUT CONTRAST
TECHNIQUE: Contiguous axial images were obtained from the base of the skull
through the vertex without intravenous contrast.

[Series 3: head 5.0 st · axial · 0.38mm/px · z∈[-88,+38]mm · 9 of 30 slices shown, 12 images]
[im 3/30  brain]
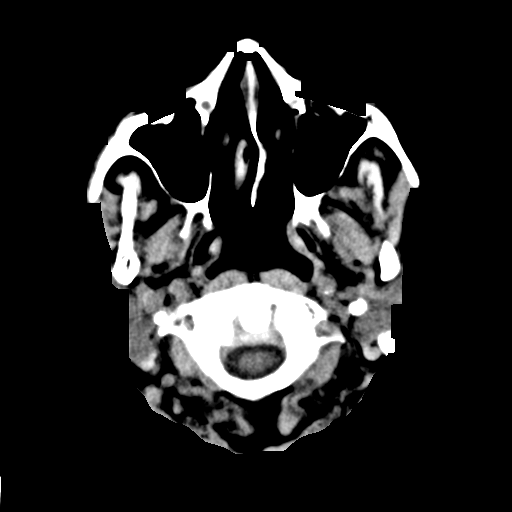
[im 3/30  bone]
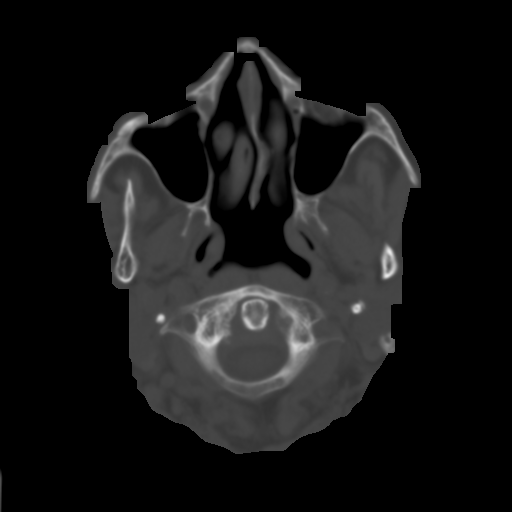
[im 6/30  brain]
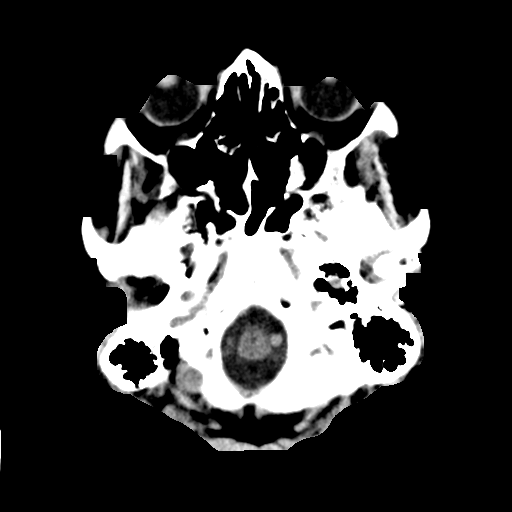
[im 9/30  brain]
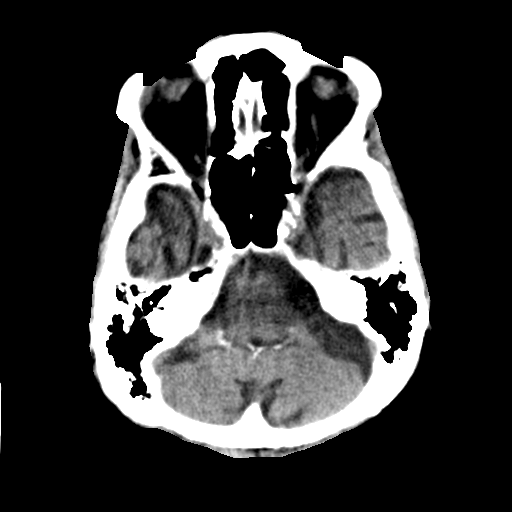
[im 12/30  brain]
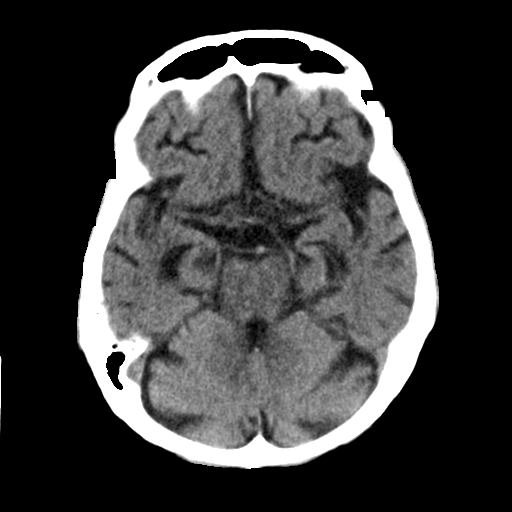
[im 16/30  brain]
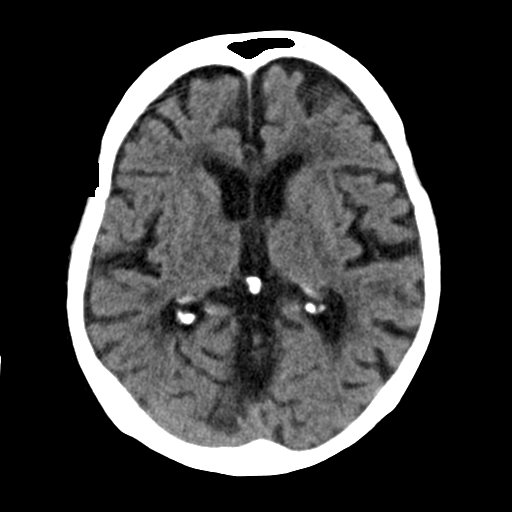
[im 16/30  bone]
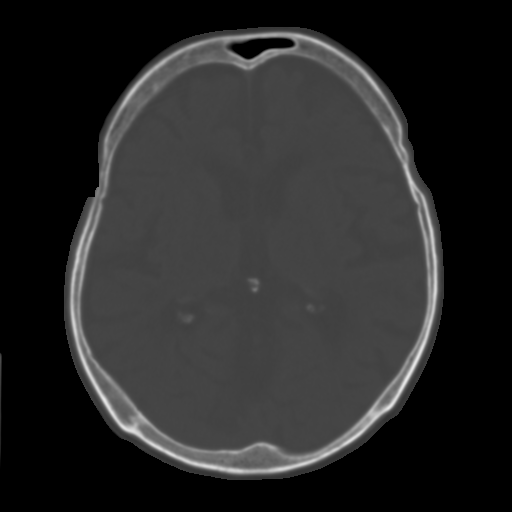
[im 19/30  brain]
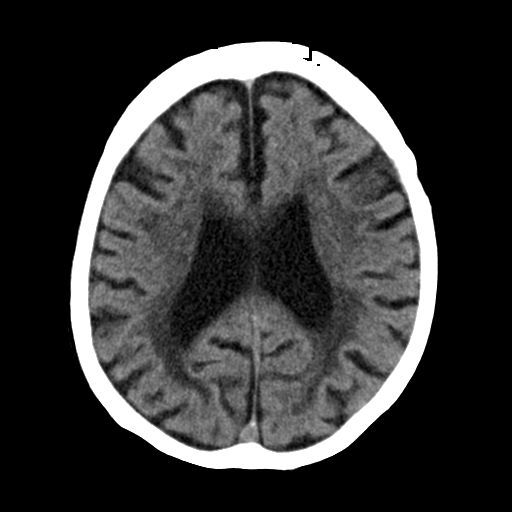
[im 22/30  brain]
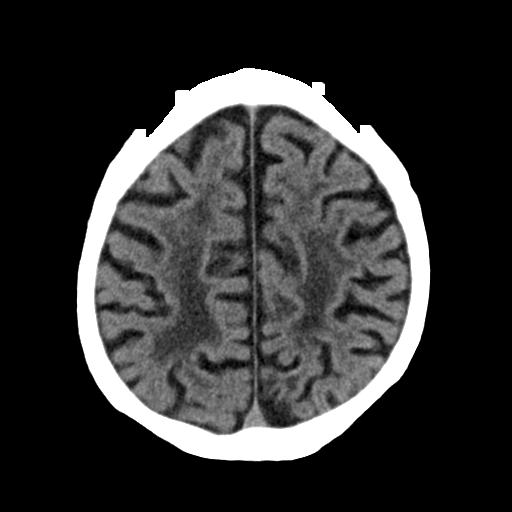
[im 25/30  brain]
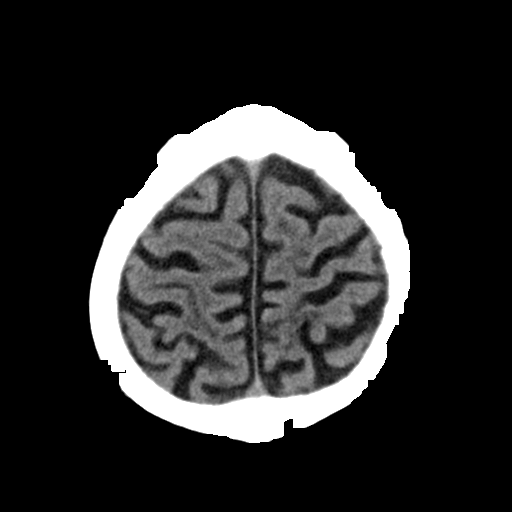
[im 28/30  brain]
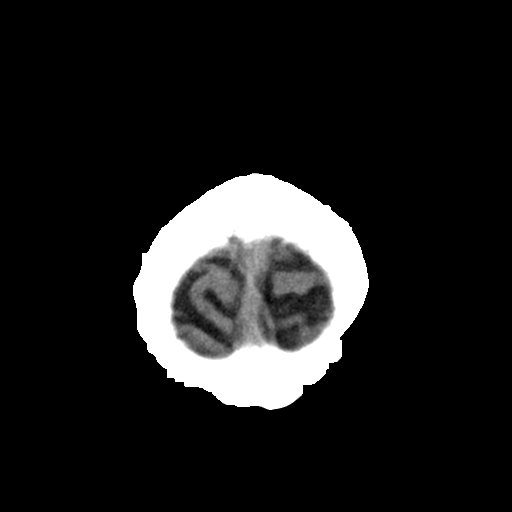
[im 28/30  bone]
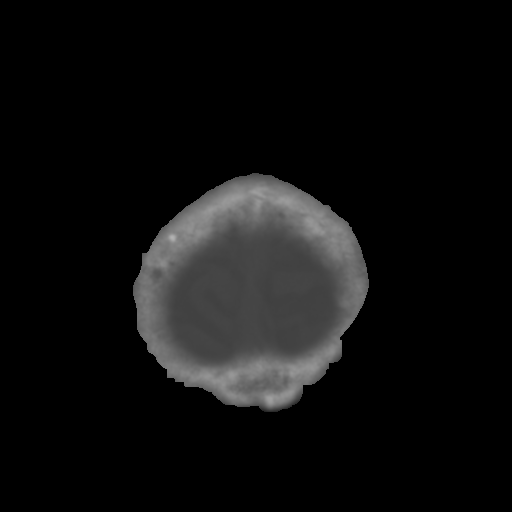

[Series 5: head 3.0 cor st · coronal · 0.29mm/px · 3 of 59 slices shown]
[im 20/59  brain]
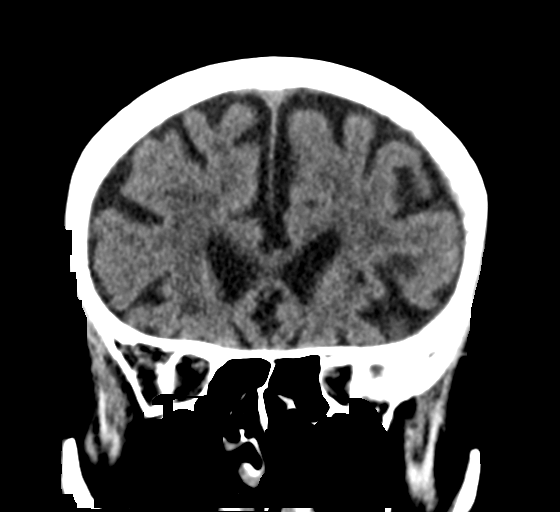
[im 26/59  brain]
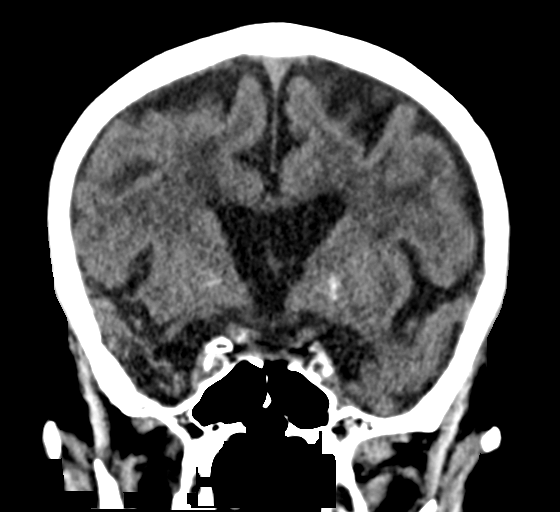
[im 33/59  brain]
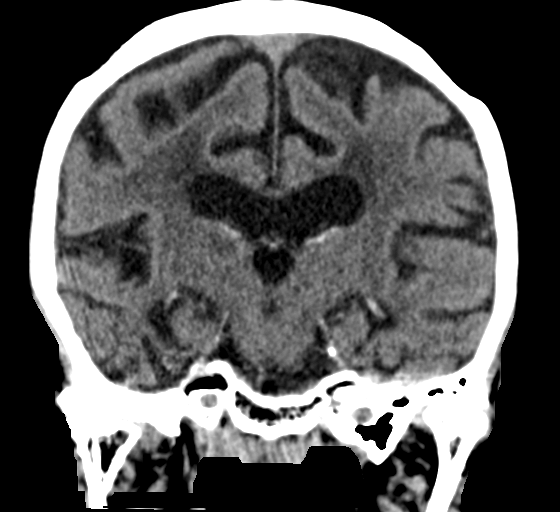

[Series 6: head 3.0 sag st · sagittal · 0.29mm/px · 3 of 48 slices shown]
[im 16/48  brain]
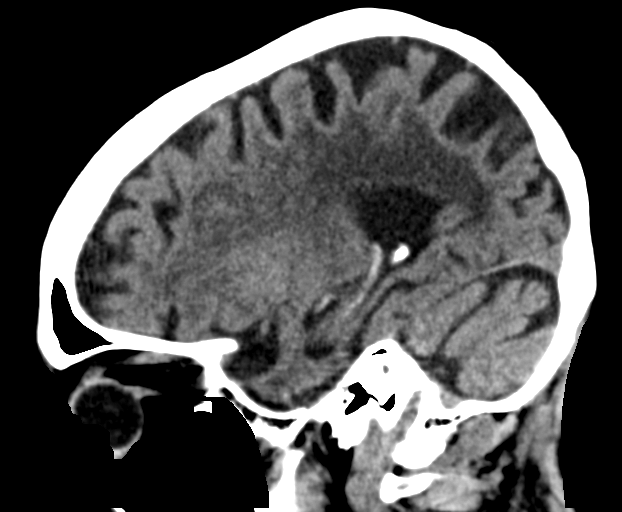
[im 24/48  brain]
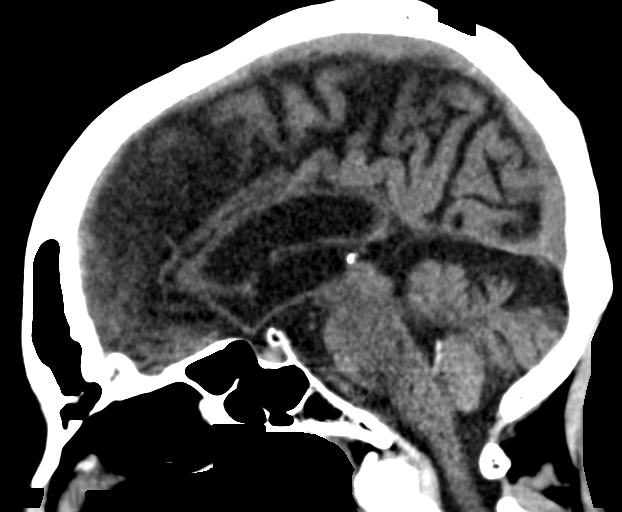
[im 32/48  brain]
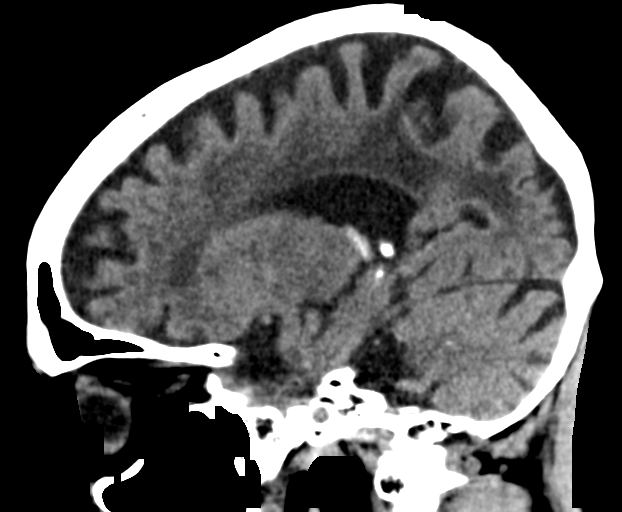

[15 of 47 positions shown; findings below may reference images not displayed]

FINDINGS: Brain: Advanced atrophy and white matter disease is present
bilaterally. Remote lacunar infarcts and dilated perivascular spaces
are noted within the basal ganglia bilaterally. Basal ganglia are
grossly intact. The insular ribbon is normal. No acute or focal
cortical defect is evident. Extensive white matter disease is
present bilaterally. The brainstem and cerebellum are normal.

Vascular: Atherosclerotic calcifications are present within the
cavernous internal carotid artery's bilaterally. There is no
hyperdense vessel.

Skull: The calvarium is within normal limits. No focal lytic or
blastic lesions are present. No significant extracranial soft tissue
injury is present.

Sinuses/Orbits: The paranasal sinuses and mastoid air cells are
clear. Cerumen is noted within the left external auditory canal.

ASPECTS (Alberta Stroke Program Early CT Score)

- Ganglionic level infarction (caudate, lentiform nuclei, internal
capsule, insula, M1-M3 cortex): [DATE]

- Supraganglionic infarction (M4-M6 cortex): [DATE]

Total score (0-10 with 10 being normal): [DATE]
IMPRESSION: 1. Advanced atrophy and white matter disease without definite acute
or focal abnormality to explain the patient's acute symptoms.
2. ASPECTS is [DATE]

These results were called by telephone at the time of interpretation
on 08/10/2016 at [DATE] to Dr. Fallon, who verbally acknowledged these
results.
